# Patient Record
Sex: Male | Born: 1980 | Marital: Single | Smoking: Never smoker
Health system: Southern US, Community
[De-identification: ages and names within clinical notes are randomized; demographics above are authoritative.]

---

## 2013-02-03 ENCOUNTER — Ambulatory Visit (INDEPENDENT_AMBULATORY_CARE_PROVIDER_SITE_OTHER): Payer: No Typology Code available for payment source | Admitting: Family Medicine

## 2013-02-03 VITALS — BP 118/80 | HR 86 | Temp 98.1°F | Resp 20 | Ht 70.75 in | Wt 163.2 lb

## 2013-02-03 DIAGNOSIS — J029 Acute pharyngitis, unspecified: Secondary | ICD-10-CM

## 2013-02-03 DIAGNOSIS — R509 Fever, unspecified: Secondary | ICD-10-CM

## 2013-02-03 MED ORDER — AMOXICILLIN 875 MG PO TABS: 875.0000 mg | ORAL_TABLET | Freq: Two times a day (BID) | ORAL | Status: DC

## 2013-02-03 NOTE — Progress Notes (Signed)
°  Subjective:    Patient ID: David Mcpherson, male    DOB: 1980-08-25, 32 y.o.   MRN: [id number]  This chart was scribed for Elvina Sidle, MD by Blanchard Kelch, ED Scribe. The patient was seen in room 10. Patient's care was started at 5:19 PM.   HPI  David Mcpherson is a 32 y.o. male who presents to office complaining of sore throat that began four days ago. He is also complaining of fatigue, fever, postnasal drainage and myalgias. He has been taking Motrin for the fever, which has been temporarily relieving the fever. He states it has not yet broken and still has the fever when the medicine wears off.   He is going out of town tomorrow to [place] for a work conference and wanted to get checked out because the symptoms have remained unchanged since starting four days ago.   He works at [employer]. 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    Review of Systems  Constitutional: Positive for fever and fatigue.  HENT: Positive for postnasal drip and sore throat. Negative for drooling.   Eyes: Negative for discharge.  Respiratory: Negative for cough.   Cardiovascular: Negative for leg swelling.  Gastrointestinal: Negative for vomiting.  Endocrine: Negative for polyuria.  Genitourinary: Negative for hematuria.  Musculoskeletal: Positive for myalgias. Negative for gait problem.  Skin: Negative for rash.  Allergic/Immunologic: Negative for immunocompromised state.  Neurological: Negative for speech difficulty.  Hematological: Negative for adenopathy.  Psychiatric/Behavioral: Negative for confusion.       Objective:   Physical Exam  Constitutional: He is oriented to person, place, and time. He appears well-developed and well-nourished.  HENT:  Head: Normocephalic and atraumatic.  Right Ear: Tympanic membrane, external ear and ear canal normal.  Left Ear: Tympanic membrane, external ear and ear canal normal.  Mouth/Throat: Posterior  oropharyngeal erythema present.  Eyes: EOM are normal.  Neck: Normal range of motion. Neck supple.  Cardiovascular: Normal rate, regular rhythm and normal heart sounds.   No murmur heard. Pulmonary/Chest: Effort normal and breath sounds normal. No respiratory distress. He has no wheezes.  Neurological: He is alert and oriented to person, place, and time.  Skin: Skin is warm and dry.      Assessment & Plan:    I personally performed the services described in this documentation, which was scribed in my presence. The recorded information has been reviewed and is accurate.  Acute pharyngitis - Plan: amoxicillin (AMOXIL) 875 MG tablet  Fever, unspecified - Plan: amoxicillin (AMOXIL) 875 MG tablet  Signed, Elvina Sidle, MD

## 2014-12-27 ENCOUNTER — Ambulatory Visit (INDEPENDENT_AMBULATORY_CARE_PROVIDER_SITE_OTHER): Payer: 59

## 2014-12-27 ENCOUNTER — Ambulatory Visit (INDEPENDENT_AMBULATORY_CARE_PROVIDER_SITE_OTHER): Payer: 59 | Admitting: Emergency Medicine

## 2014-12-27 DIAGNOSIS — R55 Syncope and collapse: Secondary | ICD-10-CM | POA: Diagnosis not present

## 2014-12-27 DIAGNOSIS — E860 Dehydration: Secondary | ICD-10-CM | POA: Diagnosis not present

## 2014-12-27 DIAGNOSIS — R05 Cough: Secondary | ICD-10-CM | POA: Diagnosis not present

## 2014-12-27 DIAGNOSIS — R059 Cough, unspecified: Secondary | ICD-10-CM

## 2014-12-27 DIAGNOSIS — J189 Pneumonia, unspecified organism: Secondary | ICD-10-CM | POA: Diagnosis not present

## 2014-12-27 LAB — POCT CBC
Granulocyte percent: 82.1 %G — AB (ref 37–80)
HEMATOCRIT: 42 % — AB (ref 43.5–53.7)
Hemoglobin: 13.3 g/dL — AB (ref 14.1–18.1)
Lymph, poc: 1.5 (ref 0.6–3.4)
MCH, POC: 29.7 pg (ref 27–31.2)
MCHC: 31.6 g/dL — AB (ref 31.8–35.4)
MCV: 93.9 fL (ref 80–97)
MID (CBC): 0.6 (ref 0–0.9)
MPV: 6 fL (ref 0–99.8)
POC GRANULOCYTE: 9.7 — AB (ref 2–6.9)
POC LYMPH PERCENT: 12.9 %L (ref 10–50)
POC MID %: 5 %M (ref 0–12)
Platelet Count, POC: 304 10*3/uL (ref 142–424)
RBC: 4.47 M/uL — AB (ref 4.69–6.13)
RDW, POC: 12.3 %
WBC: 11.8 10*3/uL — AB (ref 4.6–10.2)

## 2014-12-27 LAB — GLUCOSE, POCT (MANUAL RESULT ENTRY): POC Glucose: 96 mg/dl (ref 70–99)

## 2014-12-27 MED ORDER — CEFTRIAXONE SODIUM 1 G IJ SOLR
1.0000 g | Freq: Once | INTRAMUSCULAR | Status: AC
  Administered 2014-12-27: 1 g via INTRAMUSCULAR

## 2014-12-27 MED ORDER — HYDROCOD POLST-CPM POLST ER 10-8 MG/5ML PO SUER: 5.0000 mL | Freq: Two times a day (BID) | ORAL | Status: DC

## 2014-12-27 MED ORDER — LEVOFLOXACIN 500 MG PO TABS: 500.0000 mg | ORAL_TABLET | Freq: Every day | ORAL | Status: DC

## 2014-12-27 MED ORDER — CEFTRIAXONE SODIUM 500 MG IJ SOLR
500.0000 mg | Freq: Once | INTRAMUSCULAR | Status: AC
  Administered 2014-12-27: 500 mg via INTRAMUSCULAR

## 2014-12-27 MED ORDER — CEFTRIAXONE SODIUM 1 G IJ SOLR: 500.0000 mg | Freq: Once | INTRAMUSCULAR | Status: DC

## 2014-12-27 MED ORDER — CEFTRIAXONE SODIUM 500 MG IJ SOLR: 500.0000 mg | Freq: Once | INTRAMUSCULAR | Status: DC

## 2014-12-27 NOTE — Addendum Note (Signed)
Addended by: Eddie Candle on: 12/27/2014 09:22 PM   Modules accepted: Orders

## 2014-12-27 NOTE — Patient Instructions (Signed)

## 2014-12-27 NOTE — Progress Notes (Signed)
Subjective:  Patient ID: David Mcpherson, male    DOB: 03-29-1980  Age: 34 y.o. MRN: [id number]  CC: No chief complaint on file.   HPI David Mcpherson presents  patients been ill for over a week with with fatigue and chills and decreased by mouth intake. He has had a cough that's productive of purulent sputum. He has had some nasal congestion. No nausea or vomiting or stool change. No rash. He's been debating with his wife whether she comes Dr. be seen for a week and she prevailed insisted that he come today while the waiting room he had a syncopal episode and passed out and lost consciousness. He did not strike his head and has no antecedent injury. Now has no neurologic or visual symptoms. Is alert and oriented 3  History David Mcpherson has no past medical history on file.   He has no past surgical history on file.   His  family history includes Heart disease in his father and paternal grandfather.  He   reports that he has never smoked. He does not have any smokeless tobacco history on file. He reports that he does not drink alcohol or use illicit drugs.  Outpatient Prescriptions Prior to Visit  Medication Sig Dispense Refill  . amoxicillin (AMOXIL) 875 MG tablet Take 1 tablet (875 mg total) by mouth 2 (two) times daily. 20 tablet 0   No facility-administered medications prior to visit.    Social History   Social History  . Marital Status: Single    Spouse Name: N/A  . Number of Children: N/A  . Years of Education: N/A   Social History Main Topics  . Smoking status: Never Smoker   . Smokeless tobacco: Not on file  . Alcohol Use: No  . Drug Use: No  . Sexual Activity: Not on file   Other Topics Concern  . Not on file   Social History Narrative  . No narrative on file     Review of Systems  Constitutional: Positive for appetite change and fatigue. Negative for fever and chills.  HENT: Positive for congestion. Negative for ear pain, postnasal drip, sinus  pressure and sore throat.   Eyes: Negative for pain and redness.  Respiratory: Positive for cough. Negative for shortness of breath and wheezing.   Cardiovascular: Negative for leg swelling.  Gastrointestinal: Negative for nausea, vomiting, abdominal pain, diarrhea, constipation and blood in stool.  Endocrine: Negative for polyuria.  Genitourinary: Negative for dysuria, urgency, frequency and flank pain.  Musculoskeletal: Negative for gait problem.  Skin: Negative for rash.  Neurological: Positive for syncope (A she had a syncopal episode in the waiting room with complete collapse and loss of consciousness). Negative for weakness and headaches.  Psychiatric/Behavioral: Negative for confusion and decreased concentration. The patient is not nervous/anxious.     Objective:  There were no vitals taken for this visit.  Physical Exam  Constitutional: He is oriented to person, place, and time. He appears well-developed and well-nourished. No distress.  HENT:  Head: Normocephalic and atraumatic.  Right Ear: External ear normal.  Left Ear: External ear normal.  Nose: Nose normal.  Eyes: Conjunctivae and EOM are normal. Pupils are equal, round, and reactive to light. No scleral icterus.  Neck: Normal range of motion. Neck supple. No tracheal deviation present.  Cardiovascular: Normal rate, regular rhythm and normal heart sounds.   Pulmonary/Chest: Effort normal. No respiratory distress. He has no wheezes. He has rales.  Abdominal: He exhibits no mass. There is  no tenderness. There is no rebound and no guarding.  Musculoskeletal: He exhibits no edema.  Lymphadenopathy:    He has no cervical adenopathy.  Neurological: He is alert and oriented to person, place, and time. Coordination normal.  Skin: Skin is warm and dry. No rash noted. There is pallor.  Psychiatric: He has a normal mood and affect. His behavior is normal.      Assessment & Plan:   Diagnoses and all orders for this  visit:  Syncope, unspecified syncope type -     POCT CBC -     POCT glucose (manual entry) -     EKG 12-Lead  Dehydration  Cough -     POCT CBC -     DG Chest 2 View; Future   The patient was hydrated with two liters of saline and give 1.5 gm rocephin intravenously admixed to the second bag.  He tolerated that treatment well and felt his "normal self".  It is my opinion the pneumonia is not visible due to his dehydration.  I have discontinued David Mcpherson amoxicillin.  No orders of the defined types were placed in this encounter.   An IV was initiated  with normal saline and he was rehydrated Appropriate red flag conditions were discussed with the patient as well as actions that should be taken.  Patient expressed his understanding.  Follow-up: No Follow-up on file.  Carmelina Dane, MD     Results for orders placed or performed in visit on 12/27/14  POCT CBC  Result Value Ref Range   WBC 11.8 (A) 4.6 - 10.2 K/uL   Lymph, poc 1.5 0.6 - 3.4   POC LYMPH PERCENT 12.9 10 - 50 %L   MID (cbc) 0.6 0 - 0.9   POC MID % 5.0 0 - 12 %M   POC Granulocyte 9.7 (A) 2 - 6.9   Granulocyte percent 82.1 (A) 37 - 80 %G   RBC 4.47 (A) 4.69 - 6.13 M/uL   Hemoglobin 13.3 (A) 14.1 - 18.1 g/dL   HCT, POC 78.4 (A) 69.6 - 53.7 %   MCV 93.9 80 - 97 fL   MCH, POC 29.7 27 - 31.2 pg   MCHC 31.6 (A) 31.8 - 35.4 g/dL   RDW, POC 29.5 %   Platelet Count, POC 304 142 - 424 K/uL   MPV 6.0 0 - 99.8 fL  POCT glucose (manual entry)  Result Value Ref Range   POC Glucose 96 70 - 99 mg/dl    [facility] reading (PRIMARY) by  Dr. No visible pneumonia.Marland Kitchen

## 2015-01-08 ENCOUNTER — Ambulatory Visit (INDEPENDENT_AMBULATORY_CARE_PROVIDER_SITE_OTHER): Payer: 59 | Admitting: Family Medicine

## 2015-01-08 ENCOUNTER — Ambulatory Visit (INDEPENDENT_AMBULATORY_CARE_PROVIDER_SITE_OTHER): Payer: 59

## 2015-01-08 VITALS — BP 116/98 | HR 62 | Temp 97.9°F | Resp 16 | Ht 70.0 in | Wt 165.0 lb

## 2015-01-08 DIAGNOSIS — J189 Pneumonia, unspecified organism: Secondary | ICD-10-CM

## 2015-01-08 LAB — POCT CBC
Granulocyte percent: 70.3 %G (ref 37–80)
HEMATOCRIT: 41.4 % — AB (ref 43.5–53.7)
HEMOGLOBIN: 14.2 g/dL (ref 14.1–18.1)
LYMPH, POC: 1.5 (ref 0.6–3.4)
MCH, POC: 31.7 pg — AB (ref 27–31.2)
MCHC: 34.2 g/dL (ref 31.8–35.4)
MCV: 92.8 fL (ref 80–97)
MID (cbc): 0.3 (ref 0–0.9)
MPV: 6 fL (ref 0–99.8)
POC GRANULOCYTE: 4.2 (ref 2–6.9)
POC LYMPH %: 25.3 % (ref 10–50)
POC MID %: 4.4 % (ref 0–12)
Platelet Count, POC: 298 10*3/uL (ref 142–424)
RBC: 4.46 M/uL — AB (ref 4.69–6.13)
RDW, POC: 12.1 %
WBC: 6 10*3/uL (ref 4.6–10.2)

## 2015-01-08 MED ORDER — PREDNISONE 20 MG PO TABS: ORAL_TABLET | ORAL | Status: DC

## 2015-01-08 MED ORDER — PSEUDOEPH-BROMPHEN-DM 30-2-10 MG/5ML PO SYRP: 5.0000 mL | ORAL_SOLUTION | Freq: Four times a day (QID) | ORAL | Status: DC | PRN

## 2015-01-08 MED ORDER — ALBUTEROL SULFATE (2.5 MG/3ML) 0.083% IN NEBU
2.5000 mg | INHALATION_SOLUTION | Freq: Once | RESPIRATORY_TRACT | Status: AC
  Administered 2015-01-08: 2.5 mg via RESPIRATORY_TRACT

## 2015-01-08 MED ORDER — PHENYLEPHRINE-CHLORPHEN-DM 3.5-1-3 MG/ML PO LIQD: 2.5000 mL | Freq: Three times a day (TID) | ORAL | Status: DC | PRN

## 2015-01-08 NOTE — Patient Instructions (Signed)

## 2015-01-08 NOTE — Progress Notes (Addendum)
Subjective:    Patient ID: Praneel Haisley, male    DOB: 12-Jul-1980, 34 y.o.   MRN: [id number] Chief Complaint  Patient presents with  . Cough    x 2 weeks, Pt. was seen last week for pneumonia, but is not feeling better  . chest congestion    x 2 week  . Sinusitis    x 2 weeks    HPI  Pt was seen here 2 weeks ago and was diagnosed with pneumonia- illess started almost 3 wks ago - he had a syncopal episode from hypotension in the office and was treated with IVFs and given IM Rocephin and then started on levaquin - unfortunately, pt did not understand that an antibiotic had been sent to his pharmacy and so did not actually get the levaquin until 1 wk ago - he is on d6.  He is feeling a little better but definitely still ill.  Is having a lot of congestion and sinus pressure with post-nasal drip. He is sleeping well but is still feeling very winded during the day, getting fatigued much faster, and has DOE. He has been taking sudafed, motrin, zyrtec, zicam.  Can't take mucinex - makes him dizzy. Not having as much a fever/chills or production of cough. Pt has been taking tussionex which works at night - wakes up feeling great with less congestion, takes sudafed in the morning but sxs gradually worsen. Has no h/o any sig pulm illness, no prior RAD/asthma, no tob use or second hand smoke exposures. Does have a h/o sinus infection and cong that is difficult to treat/resolve.  History reviewed. No pertinent past medical history. Current Outpatient Prescriptions on File Prior to Visit  Medication Sig Dispense Refill  . chlorpheniramine-HYDROcodone (TUSSIONEX PENNKINETIC ER) 10-8 MG/5ML SUER Take 5 mLs by mouth 2 (two) times daily. 60 mL 0  . levofloxacin (LEVAQUIN) 500 MG tablet Take 1 tablet (500 mg total) by mouth daily. 10 tablet 0   No current facility-administered medications on file prior to visit.   No Known Allergies   Review of Systems  Constitutional: Positive for diaphoresis,  activity change and fatigue. Negative for fever and chills.  HENT: Positive for congestion, postnasal drip, rhinorrhea, sinus pressure and sore throat. Negative for ear pain, mouth sores and trouble swallowing.   Respiratory: Positive for cough, chest tightness, shortness of breath and wheezing.   Cardiovascular: Negative for chest pain, palpitations and leg swelling.  Gastrointestinal: Negative for nausea, vomiting, abdominal pain, diarrhea and constipation.  Genitourinary: Negative for dysuria.  Musculoskeletal: Negative for myalgias, arthralgias, neck pain and neck stiffness.  Skin: Negative for rash.  Neurological: Positive for dizziness and weakness. Negative for syncope.  Hematological: Negative for adenopathy.  Psychiatric/Behavioral: Positive for sleep disturbance.       Objective:  BP 116/98 mmHg  Pulse 62  Temp(Src) 97.9 F (36.6 C) (Oral)  Resp 16  Ht  (1.778 m)  Wt 165 lb (74.844 kg)  BMI 23.68 kg/m2  SpO2 98%  Physical Exam  Constitutional: He is oriented to person, place, and time. He appears well-developed and well-nourished. No distress.  HENT:  Head: Normocephalic and atraumatic.  Right Ear: External ear and ear canal normal. Tympanic membrane is retracted. A middle ear effusion is present.  Left Ear: External ear and ear canal normal. Tympanic membrane is injected. Tympanic membrane is not retracted.  No middle ear effusion.  Nose: Mucosal edema present. No rhinorrhea. Right sinus exhibits maxillary sinus tenderness. Left sinus exhibits maxillary  sinus tenderness.  Mouth/Throat: Uvula is midline and mucous membranes are normal. Posterior oropharyngeal erythema present. No oropharyngeal exudate or posterior oropharyngeal edema.  Eyes: Conjunctivae are normal. Right eye exhibits no discharge. Left eye exhibits no discharge. No scleral icterus.  Neck: Normal range of motion. Neck supple. No thyromegaly present.  Cardiovascular: Normal rate, regular rhythm,  normal heart sounds and intact distal pulses.   Pulmonary/Chest: Effort normal. No respiratory distress. He has decreased breath sounds in the right lower field, the left upper field and the left lower field. He has wheezes in the left lower field. He has rales in the left lower field.  Air movement improved sig after alb neb but lung sounds persisted and sxs unchanged.  Lymphadenopathy:       Head (right side): No submandibular adenopathy present.       Head (left side): No submandibular adenopathy present.    He has no cervical adenopathy.       Right: No supraclavicular adenopathy present.       Left: No supraclavicular adenopathy present.  Neurological: He is alert and oriented to person, place, and time.  Skin: Skin is warm and dry. He is not diaphoretic. No erythema.  Psychiatric: He has a normal mood and affect. His behavior is normal.      Results for orders placed or performed in visit on 01/08/15  POCT CBC  Result Value Ref Range   WBC 6.0 4.6 - 10.2 K/uL   Lymph, poc 1.5 0.6 - 3.4   POC LYMPH PERCENT 25.3 10 - 50 %L   MID (cbc) 0.3 0 - 0.9   POC MID % 4.4 0 - 12 %M   POC Granulocyte 4.2 2 - 6.9   Granulocyte percent 70.3 37 - 80 %G   RBC 4.46 (A) 4.69 - 6.13 M/uL   Hemoglobin 14.2 14.1 - 18.1 g/dL   HCT, POC 60.441.4 (A) 54.043.5 - 53.7 %   MCV 92.8 80 - 97 fL   MCH, POC 31.7 (A) 27 - 31.2 pg   MCHC 34.2 31.8 - 35.4 g/dL   RDW, POC 98.112.1 %   Platelet Count, POC 298 142 - 424 K/uL   MPV 6.0 0 - 99.8 fL      [facility] reading (PRIMARY) by  Dr. Clelia CroftShaw. CXR: Small infiltrate in lower rt lung but left lung improved sig.  Dg Chest 2 View  01/08/2015  CLINICAL DATA:  Followup community acquired pneumonia. EXAM: CHEST  2 VIEW COMPARISON:  12/27/2014 FINDINGS: Left lower lobe airspace disease has resolved since previous study. Right lung is clear. No evidence of pleural effusion. Heart size and mediastinal contours are within normal limits. No evidence of pneumothorax. IMPRESSION:  Resolution of left lower lobe airspace disease since prior study. No active disease. Electronically Signed   By: Myles RosenthalJohn  Stahl M.D.   On: 01/08/2015 17:02   Dg Chest 2 View  12/27/2014  CLINICAL DATA:  Shortness of breath and cough. EXAM: CHEST  2 VIEW COMPARISON:  None. FINDINGS: Normal heart size. Normal mediastinal contour. No pneumothorax. No pleural effusion. There is prominent patchy consolidation throughout the left lower lobe. No pulmonary edema. IMPRESSION: Prominent left lower lobe patchy consolidation, most in keeping with a left lower lobe pneumonia. Recommend follow-up post treatment PA and lateral chest radiographs in 6-8 weeks. These results were called by telephone at the time of interpretation on 12/27/2014 at 2:52 pm to Dr. Phillips OdorJEFFERY ANDERSON , who verbally acknowledged these results. Electronically Signed   By: Barbara CowerJason  A Poff M.D.   On: 12/27/2014 15:00    Assessment & Plan:   1. CAP (community acquired pneumonia)   Pt with definite improvement but still frustrated by the persistent cough and congestion which is mainly coming from upper respiratory. Pt will complete additional 4d of levaquin and start trial of pred taper for sinus cong. Sxs are responding well to chlorphenramine and sudafed so will try Bromphed Dm instead - cannot tolerate of guaifensin. Given trial sample of delsym. Did not have symptomatic improvement to alb neb.  Orders Placed This Encounter  Procedures  . DG Chest 2 View    Standing Status: Future     Number of Occurrences: 1     Standing Expiration Date: 01/08/2016    Order Specific Question:  Reason for Exam (SYMPTOM  OR DIAGNOSIS REQUIRED)    Answer:  pneumonia sxs persisting    Order Specific Question:  Preferred imaging location?    Answer:  External  . POCT CBC    Meds ordered this encounter  Medications  . albuterol (PROVENTIL) (2.5 MG/3ML) 0.083% nebulizer solution 2.5 mg    Sig:                  . predniSONE (DELTASONE) 20 MG tablet    Sig:  Take 3 tabs po qd x 3d, 2 tabs po qd x 3d, 1 tab po qd x 3d    Dispense:  18 tablet    Refill:  0  . brompheniramine-pseudoephedrine-DM 30-2-10 MG/5ML syrup    Sig: Take 5 mLs by mouth 4 (four) times daily as needed.    Dispense:  120 mL    Refill:  0    Norberto Sorenson, MD MPH

## 2015-07-04 ENCOUNTER — Ambulatory Visit (INDEPENDENT_AMBULATORY_CARE_PROVIDER_SITE_OTHER): Payer: 59 | Admitting: Physician Assistant

## 2015-07-04 VITALS — BP 118/70 | HR 108 | Temp 97.7°F | Resp 16 | Ht 70.0 in | Wt 166.2 lb

## 2015-07-04 DIAGNOSIS — R070 Pain in throat: Secondary | ICD-10-CM | POA: Diagnosis not present

## 2015-07-04 DIAGNOSIS — J069 Acute upper respiratory infection, unspecified: Secondary | ICD-10-CM

## 2015-07-04 DIAGNOSIS — R509 Fever, unspecified: Secondary | ICD-10-CM | POA: Diagnosis not present

## 2015-07-04 LAB — POCT RAPID STREP A (OFFICE): RAPID STREP A SCREEN: NEGATIVE

## 2015-07-04 MED ORDER — AZITHROMYCIN 250 MG PO TABS: ORAL_TABLET | ORAL | Status: DC

## 2015-07-04 NOTE — Patient Instructions (Addendum)
IF you received an x-ray today, you will receive an invoice from [facility]. Please contact [facility] at [phone] with questions or concerns regarding your invoice.   IF you received labwork today, you will receive an invoice from [employer]. Please contact [facility] at [phone] with questions or concerns regarding your invoice.   Our billing staff will not be able to assist you with questions regarding bills from these companies.  You will be contacted with the lab results as soon as they are available. The fastest way to get your results is to activate your My Chart account. Instructions are located on the last page of this paperwork. If you have not heard from [place] regarding the results in 2 weeks, please contact this office.    Continue hydration and rest. You can use cepacol lozenges for the throat pain. You will also start the flonase nasal spray at this time. Please take the zyrtec as well.  Please take the claritin at your home with 10 mg of the claritin.   Please use a humidifier.    Upper Respiratory Infection, Adult Most upper respiratory infections (URIs) are a viral infection of the air passages leading to the lungs. A URI affects the nose, throat, and upper air passages. The most common type of URI is nasopharyngitis and is typically referred to as "the common cold." URIs run their course and usually go away on their own. Most of the time, a URI does not require medical attention, but sometimes a bacterial infection in the upper airways can follow a viral infection. This is called a secondary infection. Sinus and middle ear infections are common types of secondary upper respiratory infections. Bacterial pneumonia can also complicate a URI. A URI can worsen asthma and chronic obstructive pulmonary disease (COPD). Sometimes, these complications can require emergency medical care and may be life threatening.  CAUSES Almost  all URIs are caused by viruses. A virus is a type of germ and can spread from one person to another.  RISKS FACTORS You may be at risk for a URI if:   You smoke.   You have chronic heart or lung disease.  You have a weakened defense (immune) system.   You are very young or very old.   You have nasal allergies or asthma.  You work in crowded or poorly ventilated areas.  You work in health care facilities or schools. SIGNS AND SYMPTOMS  Symptoms typically develop 2-3 days after you come in contact with a cold virus. Most viral URIs last 7-10 days. However, viral URIs from the influenza virus (flu virus) can last 14-18 days and are typically more severe. Symptoms may include:   Runny or stuffy (congested) nose.   Sneezing.   Cough.   Sore throat.   Headache.   Fatigue.   Fever.   Loss of appetite.   Pain in your forehead, behind your eyes, and over your cheekbones (sinus pain).  Muscle aches.  DIAGNOSIS  Your health care provider may diagnose a URI by:  Physical exam.  Tests to check that your symptoms are not due to another condition such as:  Strep throat.  Sinusitis.  Pneumonia.  Asthma. TREATMENT  A URI goes away on its own with time. It cannot be cured with medicines, but medicines may be prescribed or recommended to relieve symptoms. Medicines may help:  Reduce your fever.  Reduce your cough.  Relieve nasal congestion. HOME CARE INSTRUCTIONS   Take medicines only  as directed by your health care provider.   Gargle warm saltwater or take cough drops to comfort your throat as directed by your health care provider.  Use a warm mist humidifier or inhale steam from a shower to increase air moisture. This may make it easier to breathe.  Drink enough fluid to keep your urine clear or pale yellow.   Eat soups and other clear broths and maintain good nutrition.   Rest as needed.   Return to work when your temperature has returned to  normal or as your health care provider advises. You may need to stay home longer to avoid infecting others. You can also use a face mask and careful hand washing to prevent spread of the virus.  Increase the usage of your inhaler if you have asthma.   Do not use any tobacco products, including cigarettes, chewing tobacco, or electronic cigarettes. If you need help quitting, ask your health care provider. PREVENTION  The best way to protect yourself from getting a cold is to practice good hygiene.   Avoid oral or hand contact with people with cold symptoms.   Wash your hands often if contact occurs.  There is no clear evidence that vitamin C, vitamin E, echinacea, or exercise reduces the chance of developing a cold. However, it is always recommended to get plenty of rest, exercise, and practice good nutrition.  SEEK MEDICAL CARE IF:   You are getting worse rather than better.   Your symptoms are not controlled by medicine.   You have chills.  You have worsening shortness of breath.  You have brown or red mucus.  You have yellow or brown nasal discharge.  You have pain in your face, especially when you bend forward.  You have a fever.  You have swollen neck glands.  You have pain while swallowing.  You have white areas in the back of your throat. SEEK IMMEDIATE MEDICAL CARE IF:   You have severe or persistent:  Headache.  Ear pain.  Sinus pain.  Chest pain.  You have chronic lung disease and any of the following:  Wheezing.  Prolonged cough.  Coughing up blood.  A change in your usual mucus.  You have a stiff neck.  You have changes in your:  Vision.  Hearing.  Thinking.  Mood. MAKE SURE YOU:   Understand these instructions.  Will watch your condition.  Will get help right away if you are not doing well or get worse.   This information is not intended to replace advice given to you by your health care provider. Make sure you discuss any  questions you have with your health care provider.   Document Released: 09/05/2000 Document Revised: 07/27/2014 Document Reviewed: 06/17/2013 Elsevier Interactive Patient Education [employer]2016 [employer]

## 2015-07-04 NOTE — Progress Notes (Signed)
[facility] [address] [place] [address] [id number]  Date:  07/04/2015   Name:  Foday Cone   DOB:  08/12/1980   MRN:  [id number]  PCP:  No primary care provider on file.  Chief Complaint  Patient presents with  . Sore Throat    x 2 days  . Fever    x 2 days      History of Present Illness:  Ontario Pettengill is a 35 y.o. male patient who presents to [facility] for cc of sore throat  He has been having drainage from.  He vomited secondary to the drainage. Subjective fever and chills, Though no temperature documented.  Taking tylenol and motrin.  Breaking out in soaking wet sweats.  No facial pain.He reports no recent history of allergies.  He has not had a cough surrounding this.    There are no active problems to display for this patient.   History reviewed. No pertinent past medical history.  History reviewed. No pertinent past surgical history.  Social History  Substance Use Topics  . Smoking status: Never Smoker   . Smokeless tobacco: None  . Alcohol Use: No    Family History  Problem Relation Age of Onset  . Heart disease Father   . Heart disease Paternal Grandfather     No Known Allergies  Medication list has been reviewed and updated.  Current Outpatient Prescriptions on File Prior to Visit  Medication Sig Dispense Refill  . chlorpheniramine-HYDROcodone (TUSSIONEX PENNKINETIC ER) 10-8 MG/5ML SUER Take 5 mLs by mouth 2 (two) times daily. (Patient not taking: Reported on 07/04/2015) 60 mL 0  . levofloxacin (LEVAQUIN) 500 MG tablet Take 1 tablet (500 mg total) by mouth daily. (Patient not taking: Reported on 07/04/2015) 10 tablet 0   No current facility-administered medications on file prior to visit.    ROS ROS otherwise unremarkable unless listed above.  Physical Examination: BP 118/70 mmHg  Pulse 108  Temp(Src) 97.7 F (36.5 C) (Oral)  Resp 16  Ht  (1.778 m)  Wt 166 lb 3.2 oz (75.388 kg)  BMI 23.85 kg/m2   SpO2 98% Ideal Body Weight: Weight in (lb) to have BMI = 25: 173.9  Physical Exam  Constitutional: He is oriented to person, place, and time. He appears well-developed and well-nourished. No distress.  HENT:  Head: Atraumatic.  Right Ear: Tympanic membrane, external ear and ear canal normal.  Left Ear: Tympanic membrane, external ear and ear canal normal.  Nose: Mucosal edema and rhinorrhea present. Right sinus exhibits no maxillary sinus tenderness and no frontal sinus tenderness. Left sinus exhibits no maxillary sinus tenderness and no frontal sinus tenderness.  Mouth/Throat: No uvula swelling. No oropharyngeal exudate, posterior oropharyngeal edema or posterior oropharyngeal erythema.  Eyes: Conjunctivae, EOM and lids are normal. Pupils are equal, round, and reactive to light. Right eye exhibits normal extraocular motion. Left eye exhibits normal extraocular motion.  Neck: Trachea normal and full passive range of motion without pain. No edema and no erythema present.  Cardiovascular: Normal rate.   Pulmonary/Chest: Effort normal. No respiratory distress. He has no decreased breath sounds. He has no wheezes. He has no rhonchi.  Neurological: He is alert and oriented to person, place, and time.  Skin: Skin is warm and dry. He is not diaphoretic.  Psychiatric: He has a normal mood and affect. His behavior is normal.    Results for orders placed or performed in visit on 07/04/15  Culture, Group  A Strep  Result Value Ref Range   Organism ID, Bacteria Normal Upper Respiratory Flora    Organism ID, Bacteria No Beta Hemolytic Streptococci Isolated   POCT rapid strep A  Result Value Ref Range   Rapid Strep A Screen Negative Negative    Assessment and Plan: Kathrine HaddockMichael Lee Mccary is a 35 y.o. male who is here today for upper throat pain and cough. -Likely viral URI. -We will treat supportively at this time. I have advised that if he has no improvement in the next 3 days that he can start the  azithromycin.  Acute upper respiratory infection - Plan: azithromycin (ZITHROMAX) 250 MG tablet, DISCONTINUED: azithromycin (ZITHROMAX) 250 MG tablet  Throat pain - Plan: POCT rapid strep A, azithromycin (ZITHROMAX) 250 MG tablet, Culture, Group A Strep, DISCONTINUED: azithromycin (ZITHROMAX) 250 MG tablet  Fever and chills - Plan: azithromycin (ZITHROMAX) 250 MG tablet, DISCONTINUED: azithromycin (ZITHROMAX) 250 MG tablet  Trena PlattStephanie English, PA-C [facility] 07/04/2015 9:23 AM

## 2015-07-06 LAB — CULTURE, GROUP A STREP: ORGANISM ID, BACTERIA: NORMAL

## 2016-01-24 ENCOUNTER — Ambulatory Visit (INDEPENDENT_AMBULATORY_CARE_PROVIDER_SITE_OTHER): Payer: 59 | Admitting: Physician Assistant

## 2016-01-24 VITALS — BP 118/76 | HR 72 | Temp 97.5°F | Resp 20 | Ht 70.0 in | Wt 162.8 lb

## 2016-01-24 DIAGNOSIS — H6122 Impacted cerumen, left ear: Secondary | ICD-10-CM

## 2016-01-24 NOTE — Progress Notes (Signed)
   David HaddockMichael Lee Mcpherson  MRN: [id number] DOB: 09/14/1980  PCP: No primary care provider on file.  Subjective:  Pt is a 35 year old male who presents to clinic for left ear lavage. He was at [facility] this afternoon being fitted for custom ear pieces. The technician advised ear wax removal for a proper fitting. He uses Q-tips occasionally. Has no prior history of cerumen impaction.  Denies ringing of the ears, ear fullness, decreased hearing, disequilibrium.   Review of Systems  Constitutional: Negative for chills and fever.  HENT: Negative for ear discharge, ear pain, hearing loss and tinnitus.   Respiratory: Negative.   Cardiovascular: Negative.   Neurological: Negative for dizziness, light-headedness and headaches.    There are no active problems to display for this patient.   Current Outpatient Prescriptions on File Prior to Visit  Medication Sig Dispense Refill  . azithromycin (ZITHROMAX) 250 MG tablet Take 2 tabs PO x 1 dose, then 1 tab PO QD x 4 days (Patient not taking: Reported on 01/24/2016) 6 tablet 0  . chlorpheniramine-HYDROcodone (TUSSIONEX PENNKINETIC ER) 10-8 MG/5ML SUER Take 5 mLs by mouth 2 (two) times daily. (Patient not taking: Reported on 01/24/2016) 60 mL 0   No current facility-administered medications on file prior to visit.     No Known Allergies  Objective:  BP 118/76 (BP Location: Right Arm, Patient Position: Sitting, Cuff Size: Small)   Pulse 72   Temp 97.5 F (36.4 C) (Oral)   Resp 20   Ht 5\' 10"  (1.778 m)   Wt 162 lb 12.8 oz (73.8 kg)   SpO2 100%   BMI 23.36 kg/m   Physical Exam  Constitutional: He is oriented to person, place, and time and well-developed, well-nourished, and in no distress. No distress.  HENT:  Right Ear: Tympanic membrane and ear canal normal.  Left ear canal impacted with cerumen.   Cardiovascular: Normal rate, regular rhythm and normal heart sounds.   Neurological: He is alert and oriented to person, place, and  time. GCS score is 15.  Skin: Skin is warm and dry.  Psychiatric: Mood, memory, affect and judgment normal.  Vitals reviewed.   Assessment and Plan :  1. Impacted cerumen of left ear - Ear wax removal - Home care for cerumen impaction discussed with patient. D/c use of Q-tips. Use mineral oil as needed to soften cerumen to allow normal transport out of canal.  - RTC if symptoms return. Patient understands and agrees.    Marco CollieWhitney Desira Alessandrini, PA-C  [facility] 01/24/2016 4:04 PM

## 2016-01-24 NOTE — Patient Instructions (Addendum)
Please do not use Q-tips, as this will further impact the ear wax in your ear.   If you have cerumen impaction more than once a year you can reduce the occurrences by using a cotton ball dipped in mineral oil and place it in the external canal for 10-20 minutes once a week (combined with eight hours of not using a hearing aid overnight, if applicable). This helps liquify cerumen and aid in the normal elimination mechanisms.    Routine cleaning of ears by a health professional every 6-12 months is recommended.   If you have itchy ears, use sweet oil. If you need more relief use a small amount of hydrocortisone ointment.   Cerumen Impaction The structures of the external ear canal secrete a waxy substance known as cerumen. Excess cerumen can build up in the ear canal, causing a condition known as cerumen impaction. Cerumen impaction can cause ear pain and disrupt the function of the ear. The rate of cerumen production differs for each individual. In certain individuals, the configuration of the ear canal may decrease his or her ability to naturally remove cerumen. CAUSES Cerumen impaction is caused by excessive cerumen production or buildup. RISK FACTORS  Frequent use of swabs to clean ears.  Having narrow ear canals.  Having eczema.  Being dehydrated. SIGNS AND SYMPTOMS  Diminished hearing.  Ear drainage.  Ear pain.  Ear itch. TREATMENT Treatment may involve:  Over-the-counter or prescription ear drops to soften the cerumen.  Removal of cerumen by a health care provider. This may be done with:  Irrigation with warm water. This is the most common method of removal.  Ear curettes and other instruments.  Surgery. This may be done in severe cases. HOME CARE INSTRUCTIONS  Take medicines only as directed by your health care provider.  Do not insert objects into the ear with the intent of cleaning the ear. PREVENTION  Do not insert objects into the ear, even with the intent  of cleaning the ear. Removing cerumen as a part of normal hygiene is not necessary, and the use of swabs in the ear canal is not recommended.  Drink enough water to keep your urine clear or pale yellow.  Control your eczema if you have it. SEEK MEDICAL CARE IF:  You develop ear pain.  You develop bleeding from the ear.  The cerumen does not clear after you use ear drops as directed.   This information is not intended to replace advice given to you by your health care provider. Make sure you discuss any questions you have with your health care provider.   Document Released: 04/19/2004 Document Revised: 04/02/2014 Document Reviewed: 10/27/2014 Elsevier Interactive Patient Education [employer].   Thank you for coming in today. I hope you feel we met your needs.  Feel free to call [facility] if you have any questions or further requests.  Please consider signing up for MyChart if you do not already have it, as this is a great way to communicate with me.  Best,  Whitney McVey, PA-C    IF you received an x-ray today, you will receive an invoice from [facility]. Please contact [facility] at [phone] with questions or concerns regarding your invoice.   IF you received labwork today, you will receive an invoice from [employer]. Please contact [facility] at [phone] with questions or concerns regarding your invoice.   Our billing staff will not be able to assist you with questions regarding bills from these  companies.  You will be contacted with the lab results as soon as they are available. The fastest way to get your results is to activate your My Chart account. Instructions are located on the last page of this paperwork. If you have not heard from [place] regarding the results in 2 weeks, please contact this office.

## 2016-05-03 ENCOUNTER — Ambulatory Visit (INDEPENDENT_AMBULATORY_CARE_PROVIDER_SITE_OTHER): Payer: 59 | Admitting: Physician Assistant

## 2016-05-03 VITALS — BP 110/72 | HR 72 | Temp 97.7°F | Resp 16 | Ht 70.0 in | Wt 158.8 lb

## 2016-05-03 DIAGNOSIS — Z298 Encounter for other specified prophylactic measures: Secondary | ICD-10-CM | POA: Diagnosis not present

## 2016-05-03 DIAGNOSIS — T753XXA Motion sickness, initial encounter: Secondary | ICD-10-CM | POA: Diagnosis not present

## 2016-05-03 MED ORDER — ONDANSETRON HCL 4 MG PO TABS: 4.0000 mg | ORAL_TABLET | Freq: Three times a day (TID) | ORAL | 0 refills | Status: DC | PRN

## 2016-05-03 MED ORDER — DOXYCYCLINE HYCLATE 100 MG PO CAPS: ORAL_CAPSULE | ORAL | 0 refills | Status: DC

## 2016-05-03 NOTE — Patient Instructions (Addendum)
Have a GREAT trip! Happy 10th Anniversary!    IF you received an x-ray today, you will receive an invoice from [facility]. Please contact [facility] at [phone] with questions or concerns regarding your invoice.   IF you received labwork today, you will receive an invoice from [facility]. Please contact [facility] at [phone] with questions or concerns regarding your invoice.   Our billing staff will not be able to assist you with questions regarding bills from these companies.  You will be contacted with the lab results as soon as they are available. The fastest way to get your results is to activate your My Chart account. Instructions are located on the last page of this paperwork. If you have not heard from us regarding the results in 2 weeks, please contact this office.

## 2016-05-03 NOTE — Progress Notes (Signed)
     Patient ID: Kathrine HaddockMichael Lee Wuest, male    DOB: 07/10/1980, 36 y.o.   MRN: [id number]  PCP: No primary care provider on file.  Chief Complaint  Patient presents with  . Travel Consult    need medication     Subjective:   Presents for consultation prior to travel to the [place] next week.  He and his wife are traveling to celebrate their 10th wedding anniversary and will be staying at a resort. They have no plans to hike or travel to rural areas or go caving. None-the-less, they have been advised to take malaria prophylaxis.    Review of Systems  Constitutional: Negative for chills and fever.  Respiratory: Negative for cough and shortness of breath.   Cardiovascular: Negative for chest pain, palpitations and leg swelling.  Gastrointestinal: Negative for diarrhea, nausea and vomiting.  Endocrine: Negative for polydipsia.  Genitourinary: Negative for dysuria, frequency and urgency.  Musculoskeletal: Negative for myalgias.  Skin: Negative for rash.  Neurological: Negative for dizziness and headaches.       There are no active problems to display for this patient.    Prior to Admission medications   Not on File     No Known Allergies     Objective:  Physical Exam  Constitutional: He is oriented to person, place, and time. He appears well-developed and well-nourished. He is active and cooperative. No distress.  BP 110/72 (BP Location: Right Arm, Patient Position: Sitting, Cuff Size: Small)   Pulse 72   Temp 97.7 F (36.5 C) (Oral)   Resp 16   Ht 5\' 10"  (1.778 m)   Wt 158 lb 12.8 oz (72 kg)   SpO2 99%   BMI 22.79 kg/m    Eyes: Conjunctivae are normal.  Pulmonary/Chest: Effort normal.  Neurological: He is alert and oriented to person, place, and time.  Psychiatric: He has a normal mood and affect. His speech is normal and behavior is normal.           Assessment & Plan:   1. Motion sickness, initial encounter Ondansetron works well for his  symptoms. - ondansetron (ZOFRAN) 4 MG tablet; Take 1-2 tablets (4-8 mg total) by mouth every 8 (eight) hours as needed for nausea or vomiting.  Dispense: 30 tablet; Refill: 0  2. Need for malaria prophylaxis If he talks with his wife and would rather have atovaquone-proguanil, he'll let me know. - doxycycline (VIBRAMYCIN) 100 MG capsule; Take one time daily beginning 1-2 days prior to travel and for 4 weeks upon return  Dispense: 42 capsule; Refill: 0   Fernande Brashelle S. Yoshiko Keleher, PA-C Physician Assistant-Certified Primary Care at [facility]

## 2016-05-07 ENCOUNTER — Other Ambulatory Visit: Payer: Self-pay | Admitting: Physician Assistant

## 2016-05-07 DIAGNOSIS — Z20828 Contact with and (suspected) exposure to other viral communicable diseases: Secondary | ICD-10-CM

## 2016-05-07 MED ORDER — OSELTAMIVIR PHOSPHATE 75 MG PO CAPS: 75.0000 mg | ORAL_CAPSULE | Freq: Every day | ORAL | 0 refills | Status: AC

## 2016-07-11 ENCOUNTER — Ambulatory Visit (INDEPENDENT_AMBULATORY_CARE_PROVIDER_SITE_OTHER): Payer: 59 | Admitting: Physician Assistant

## 2016-07-11 ENCOUNTER — Ambulatory Visit (INDEPENDENT_AMBULATORY_CARE_PROVIDER_SITE_OTHER): Payer: 59

## 2016-07-11 VITALS — BP 129/88 | HR 91 | Temp 97.5°F | Resp 18 | Ht 70.0 in | Wt 165.0 lb

## 2016-07-11 DIAGNOSIS — R9431 Abnormal electrocardiogram [ECG] [EKG]: Secondary | ICD-10-CM

## 2016-07-11 DIAGNOSIS — R002 Palpitations: Secondary | ICD-10-CM | POA: Diagnosis not present

## 2016-07-11 NOTE — Patient Instructions (Signed)
     IF you received an x-ray today, you will receive an invoice from [facility]. Please contact [facility] at [phone] with questions or concerns regarding your invoice.   IF you received labwork today, you will receive an invoice from [facility]. Please contact [facility] at [phone] with questions or concerns regarding your invoice.   Our billing staff will not be able to assist you with questions regarding bills from these companies.  You will be contacted with the lab results as soon as they are available. The fastest way to get your results is to activate your My Chart account. Instructions are located on the last page of this paperwork. If you have not heard from us regarding the results in 2 weeks, please contact this office.     

## 2016-07-11 NOTE — Progress Notes (Signed)
David Mcpherson  MRN: [id number] DOB: May 16, 1980  PCP: No PCP Per Patient  Subjective:  Pt is 36 year old no reported PMH who presents to clinic for episodes of his heart fluttering x 10 years.  Episodes are occurring more frequently, which is why her is here today.  He experiences "spells" where his heart will "skip beats" and his chest "feels empty".  At least once a day for the past month. Spells last about 1 minute. Nothing provokes them. Nothing makes them better.   This has happened to him before - First started noticing it when he drank caffeine - saw cardiologist and was negative for arhythmia. He has cut out caffeine before - his symptoms went away.  Denies chest pain, chest tightness, syncope, pre-syncope, headache.   He drinks coffee in the morning and 1-2 cokes daily. He stopped drinking caffeine last week - episodes are still occurring.  He drinks alcohol - in the past month or two he has had more alcohol than he normally does due to "random social events".   Hx seasonal allergies - flonase, sudafed 12 hr, motrin, claritin. Took these this morning due to increased allergy symptoms.  He is not on any daily medication.   Fhx - Father: atrial fibrillation with valve replacement.   Review of Systems  Constitutional: Negative for diaphoresis.  Respiratory: Negative for cough, chest tightness, shortness of breath and wheezing.   Cardiovascular: Positive for palpitations. Negative for chest pain and leg swelling.  Gastrointestinal: Negative for diarrhea, nausea and vomiting.  Neurological: Negative for dizziness, syncope, light-headedness and headaches.    There are no active problems to display for this patient.   No current outpatient prescriptions on file prior to visit.   No current facility-administered medications on file prior to visit.     No Known Allergies   Objective:  BP 129/88   Pulse 91   Temp 97.5 F (36.4 C) (Oral)   Resp 18   Ht '5\' 10"'  (1.778 m)    Wt 165 lb (74.8 kg)   SpO2 100%   BMI 23.68 kg/m   Physical Exam  Constitutional: He is oriented to person, place, and time and well-developed, well-nourished, and in no distress. No distress.  Neck: Carotid bruit is not present.  Cardiovascular: Normal rate, regular rhythm, S1 normal, S2 normal, normal heart sounds and normal pulses.   No murmur heard. Neurological: He is alert and oriented to person, place, and time. GCS score is 15.  Skin: Skin is warm and dry.  Psychiatric: Mood, memory, affect and judgment normal.  Vitals reviewed.   Dg Chest 2 View  Result Date: 07/11/2016 CLINICAL DATA:  Palpitations. EXAM: CHEST  2 VIEW COMPARISON:  01/08/2015 FINDINGS: The heart size and mediastinal contours are within normal limits. Both lungs are clear. The visualized skeletal structures are unremarkable. IMPRESSION: Normal chest x-ray. Electronically Signed   By: Marijo Sanes M.D.   On: 07/11/2016 13:37   EKG - left axis deviation, possible hypertrophy. No ST elevations. No atrial fibrillation.  Assessment and Plan :  1. Palpitations 2. Nonspecific abnormal electrocardiogram (ECG) (EKG) - EKG 12-Lead - TSH - CBC with Differential/Platelet - CMP14+EGFR - Ambulatory referral to Cardiology - DG Chest 2 View; Future - Acute on chronic palpitations .Pt c/o worsening palpitations over the past month. He has tried cutting out caffeine, however admits to increased amount of alcohol intake over the past month.  No acute findings in EKG. Chest x-ray is negative. Labs are  pending, will contact with results. Encouraged pt to cut down on/stop alcohol and caffeine. Will refer to cardiology.   Mercer Pod, PA-C  [facility] Care at [facility] 07/11/2016 12:27 PM

## 2016-07-12 LAB — CMP14+EGFR
ALT: 15 IU/L (ref 0–44)
AST: 15 IU/L (ref 0–40)
Albumin/Globulin Ratio: 1.8 (ref 1.2–2.2)
Albumin: 4.6 g/dL (ref 3.5–5.5)
Alkaline Phosphatase: 54 IU/L (ref 39–117)
BUN/Creatinine Ratio: 11 (ref 9–20)
BUN: 12 mg/dL (ref 6–20)
Bilirubin Total: 0.4 mg/dL (ref 0.0–1.2)
CO2: 27 mmol/L (ref 18–29)
Calcium: 9.1 mg/dL (ref 8.7–10.2)
Chloride: 99 mmol/L (ref 96–106)
Creatinine, Ser: 1.09 mg/dL (ref 0.76–1.27)
GFR calc Af Amer: 100 mL/min/{1.73_m2} (ref >59)
GFR calc non Af Amer: 86 mL/min/{1.73_m2} (ref >59)
Globulin, Total: 2.6 g/dL (ref 1.5–4.5)
Glucose: 87 mg/dL (ref 65–99)
Potassium: 4.3 mmol/L (ref 3.5–5.2)
Sodium: 141 mmol/L (ref 134–144)
Total Protein: 7.2 g/dL (ref 6.0–8.5)

## 2016-07-12 LAB — CBC WITH DIFFERENTIAL/PLATELET
Basophils Absolute: 0 10*3/uL (ref 0.0–0.2)
Basos: 0 %
EOS (ABSOLUTE): 0.1 10*3/uL (ref 0.0–0.4)
Eos: 1 %
Hematocrit: 40.9 % (ref 37.5–51.0)
Hemoglobin: 13.5 g/dL (ref 13.0–17.7)
Immature Grans (Abs): 0 10*3/uL (ref 0.0–0.1)
Immature Granulocytes: 0 %
Lymphocytes Absolute: 1.2 10*3/uL (ref 0.7–3.1)
Lymphs: 29 %
MCH: 31.5 pg (ref 26.6–33.0)
MCHC: 33 g/dL (ref 31.5–35.7)
MCV: 96 fL (ref 79–97)
Monocytes Absolute: 0.4 10*3/uL (ref 0.1–0.9)
Monocytes: 9 %
Neutrophils Absolute: 2.6 10*3/uL (ref 1.4–7.0)
Neutrophils: 61 %
Platelets: 159 10*3/uL (ref 150–379)
RBC: 4.28 x10E6/uL (ref 4.14–5.80)
RDW: 13.1 % (ref 12.3–15.4)
WBC: 4.2 10*3/uL (ref 3.4–10.8)

## 2016-07-12 LAB — TSH: TSH: 0.656 u[IU]/mL (ref 0.450–4.500)

## 2016-07-12 NOTE — Progress Notes (Signed)
Please call pt and let him know his thyroid, kindeys, liver and blood counts look great. They are all within normal limits.  Thank you!

## 2016-07-19 DIAGNOSIS — R002 Palpitations: Secondary | ICD-10-CM | POA: Diagnosis not present

## 2016-07-19 DIAGNOSIS — R9431 Abnormal electrocardiogram [ECG] [EKG]: Secondary | ICD-10-CM | POA: Diagnosis not present

## 2016-07-20 DIAGNOSIS — R002 Palpitations: Secondary | ICD-10-CM | POA: Diagnosis not present

## 2016-07-30 DIAGNOSIS — R9431 Abnormal electrocardiogram [ECG] [EKG]: Secondary | ICD-10-CM | POA: Diagnosis not present

## 2016-08-07 ENCOUNTER — Encounter: Payer: Self-pay | Admitting: Physician Assistant

## 2016-08-07 ENCOUNTER — Ambulatory Visit (INDEPENDENT_AMBULATORY_CARE_PROVIDER_SITE_OTHER): Payer: 59 | Admitting: Physician Assistant

## 2016-08-07 VITALS — BP 136/78 | HR 98 | Temp 98.6°F | Ht 70.0 in | Wt 160.2 lb

## 2016-08-07 DIAGNOSIS — R059 Cough, unspecified: Secondary | ICD-10-CM

## 2016-08-07 DIAGNOSIS — R05 Cough: Secondary | ICD-10-CM

## 2016-08-07 DIAGNOSIS — J22 Unspecified acute lower respiratory infection: Secondary | ICD-10-CM

## 2016-08-07 MED ORDER — BENZONATATE 100 MG PO CAPS: 100.0000 mg | ORAL_CAPSULE | Freq: Three times a day (TID) | ORAL | 0 refills | Status: DC | PRN

## 2016-08-07 MED ORDER — GUAIFENESIN ER 1200 MG PO TB12: 1.0000 | ORAL_TABLET | Freq: Two times a day (BID) | ORAL | 1 refills | Status: DC | PRN

## 2016-08-07 MED ORDER — AZITHROMYCIN 250 MG PO TABS: ORAL_TABLET | ORAL | 0 refills | Status: DC

## 2016-08-07 NOTE — Progress Notes (Signed)
[facility] [address], [place] [phone]  Date:  08/07/2016   Name:  David Mcpherson   DOB:  07/23/1980   MRN:  [id number]  PCP:  Patient, No Pcp Per    History of Present Illness:  David Mcpherson is a 36 y.o. male patient who presents to PCP with  Chief Complaint  Patient presents with  . Cough    4 weeks cough and congestion       4 weeks symptoms began with productive cough, and fever for 1 night.  Energy had improved but had a lingering cough.  In the last week, cough and congestion, fatigue.  Coughing fits can cause some trouble with breathing, but otherwise normal.  No hoarseness, ear discomfort.  He had some sneezing and watery eyes, but this improved.  Productive cough is clear sputum.  He has some post nasal drip.  No facial pain.      There are no active problems to display for this patient.   No past medical history on file.  No past surgical history on file.  Social History  Substance Use Topics  . Smoking status: Never Smoker  . Smokeless tobacco: Never Used  . Alcohol use No    Family History  Problem Relation Age of Onset  . Heart disease Father   . Heart disease Paternal Grandfather     No Known Allergies  Medication list has been reviewed and updated.  No current outpatient prescriptions on file prior to visit.   No current facility-administered medications on file prior to visit.     ROS ROS otherwise unremarkable unless listed above.  Physical Examination: BP 136/78 (BP Location: Right Arm, Patient Position: Sitting, Cuff Size: Normal)   Pulse 98   Temp 98.6 F (37 C) (Oral)   Ht 5\' 10"  (1.778 m)   Wt 160 lb 3.2 oz (72.7 kg)   BMI 22.99 kg/m  Ideal Body Weight: Weight in (lb) to have BMI = 25: 173.9  Physical Exam  Constitutional: He is oriented to person, place, and time. He appears well-developed and well-nourished. No distress.  HENT:  Head: Normocephalic and atraumatic.  Right Ear:  Tympanic membrane, external ear and ear canal normal.  Left Ear: Tympanic membrane, external ear and ear canal normal.  Nose: Mucosal edema and rhinorrhea present. Right sinus exhibits no maxillary sinus tenderness and no frontal sinus tenderness. Left sinus exhibits no maxillary sinus tenderness and no frontal sinus tenderness.  Mouth/Throat: No uvula swelling. No oropharyngeal exudate, posterior oropharyngeal edema or posterior oropharyngeal erythema.  Eyes: Conjunctivae, EOM and lids are normal. Pupils are equal, round, and reactive to light. Right eye exhibits normal extraocular motion. Left eye exhibits normal extraocular motion.  Neck: Trachea normal and full passive range of motion without pain. No edema and no erythema present.  Cardiovascular: Normal rate.   Pulmonary/Chest: Effort normal. No respiratory distress. He has no decreased breath sounds. He has no wheezes. He has no rhonchi.  Neurological: He is alert and oriented to person, place, and time.  Skin: Skin is warm and dry. He is not diaphoretic.  Psychiatric: He has a normal mood and affect. His behavior is normal.     Assessment and Plan: David Mcpherson is a 36 y.o. male who is here today for UR symptoms. Will treat for bacterial etiology given longevity and worsening symptoms.   Advised heavy hydration rtc as needed.  Lower respiratory infection (e.g., bronchitis, pneumonia, pneumonitis, pulmonitis) -  Plan: azithromycin (ZITHROMAX) 250 MG tablet, Guaifenesin (MUCINEX MAXIMUM STRENGTH) 1200 MG TB12, benzonatate (TESSALON) 100 MG capsule  Cough - Plan: benzonatate (TESSALON) 100 MG capsule  Trena Platt, PA-C [facility] 5/16/201812:06 PM

## 2016-08-07 NOTE — Patient Instructions (Addendum)
Please continue to hydrate well with 64 oz of water if not more You can try taking 600mg  of the mucinex twice per day then increase to 2 tablets twice per day.  To insure that you are not getting to dizzy, we will start lower.      IF you received an x-ray today, you will receive an invoice from [facility]. Please contact [facility] at [phone] with questions or concerns regarding your invoice.   IF you received labwork today, you will receive an invoice from [facility]. Please contact [facility] at [phone] with questions or concerns regarding your invoice.   Our billing staff will not be able to assist you with questions regarding bills from these companies.  You will be contacted with the lab results as soon as they are available. The fastest way to get your results is to activate your My Chart account. Instructions are located on the last page of this paperwork. If you have not heard from us regarding the results in 2 weeks, please contact this office.

## 2016-08-23 DIAGNOSIS — R002 Palpitations: Secondary | ICD-10-CM | POA: Diagnosis not present

## 2016-09-10 ENCOUNTER — Ambulatory Visit (INDEPENDENT_AMBULATORY_CARE_PROVIDER_SITE_OTHER): Payer: 59 | Admitting: Physician Assistant

## 2016-09-10 ENCOUNTER — Encounter: Payer: Self-pay | Admitting: Physician Assistant

## 2016-09-10 VITALS — BP 121/79 | HR 85 | Temp 98.5°F | Resp 16 | Ht 71.0 in | Wt 161.0 lb

## 2016-09-10 DIAGNOSIS — R21 Rash and other nonspecific skin eruption: Secondary | ICD-10-CM

## 2016-09-10 DIAGNOSIS — L299 Pruritus, unspecified: Secondary | ICD-10-CM

## 2016-09-10 MED ORDER — HYDROXYZINE HCL 25 MG PO TABS: 25.0000 mg | ORAL_TABLET | Freq: Four times a day (QID) | ORAL | 1 refills | Status: AC | PRN

## 2016-09-10 MED ORDER — PREDNISONE 20 MG PO TABS: ORAL_TABLET | ORAL | 0 refills | Status: AC

## 2016-09-10 NOTE — Progress Notes (Signed)
   Kathrine HaddockMichael Lee Carbon  MRN: [id number] DOB: 11/03/1980  PCP: Patient, No Pcp Per  Subjective:  Pt is a 36 year old male who presents to clinic for bites on his legs x 1 day. He was out in the woods 2 days ago and developed "over 100 bug bites" on his legs the next day. C/o itchiness and pain. He did not sleep last night because of these symptoms.  He has tried calamine lotion, hydrocortisone cream, benadryl, zyrtec and prednisone 20mg .   Denies abdominal pain, n/v, headache, fever, chills, swelling, n/t.   Review of Systems  Gastrointestinal: Negative for abdominal pain, diarrhea, nausea and vomiting.  Musculoskeletal: Negative for arthralgias, myalgias and neck stiffness.  Skin: Positive for rash.  Neurological: Negative for dizziness, weakness and headaches.  Psychiatric/Behavioral: Positive for sleep disturbance.    There are no active problems to display for this patient.   No current outpatient prescriptions on file prior to visit.   No current facility-administered medications on file prior to visit.     No Known Allergies   Objective:  BP 121/79   Pulse 85   Temp 98.5 F (36.9 C) (Oral)   Resp 16   Ht 5\' 11"  (1.803 m)   Wt 161 lb (73 kg)   SpO2 99%   BMI 22.45 kg/m   Physical Exam  Constitutional: He is oriented to person, place, and time and well-developed, well-nourished, and in no distress. No distress.  Cardiovascular: Normal rate, regular rhythm and normal heart sounds.   Neurological: He is alert and oriented to person, place, and time. GCS score is 15.  Skin: Skin is warm and dry. Rash noted.  Several dozen 1 cm lesions scattered about upper and lower LES. Feet are not affected. Raised erythematous papule with vesicular center. Not warm to touch. No bleeding, drainage, weeping. No pustules.   Psychiatric: Mood, memory, affect and judgment normal.  Vitals reviewed.   Assessment and Plan :  1. Rash and nonspecific skin eruption - predniSONE (DELTASONE)  20 MG tablet; Take 3 PO QAM x2days, 2 PO QAM x2days, 1 PO QAM x2days  Dispense: 12 tablet; Refill: 0  2. Itch - hydrOXYzine (ATARAX/VISTARIL) 25 MG tablet; Take 1 tablet (25 mg total) by mouth every 6 (six) hours as needed for itching.  Dispense: 60 tablet; Refill: 1 - Advised washing with soap and water. Con't topical calamine lotion. RTC in 5-7 days if no improvement.   Marco CollieWhitney Liani Caris, PA-C  [facility] 09/10/2016 11:39 AM

## 2016-09-10 NOTE — Patient Instructions (Addendum)
Please come back if you are not better in 5-7 days.   Thank you for coming in today. I hope you feel we met your needs.  Feel free to call [facility] if you have any questions or further requests.  Please consider signing up for MyChart if you do not already have it, as this is a great way to communicate with me.  Best,  Whitney McVey, PA-C   IF you received an x-ray today, you will receive an invoice from [facility]. Please contact [facility] at [phone] with questions or concerns regarding your invoice.   IF you received labwork today, you will receive an invoice from [facility]. Please contact [facility] at [phone] with questions or concerns regarding your invoice.   Our billing staff will not be able to assist you with questions regarding bills from these companies.  You will be contacted with the lab results as soon as they are available. The fastest way to get your results is to activate your My Chart account. Instructions are located on the last page of this paperwork. If you have not heard from [place] regarding the results in 2 weeks, please contact this office.

## 2017-01-28 DIAGNOSIS — Z23 Encounter for immunization: Secondary | ICD-10-CM | POA: Diagnosis not present

## 2017-02-18 DIAGNOSIS — J385 Laryngeal spasm: Secondary | ICD-10-CM | POA: Diagnosis not present

## 2017-02-18 DIAGNOSIS — R490 Dysphonia: Secondary | ICD-10-CM | POA: Diagnosis not present

## 2017-02-18 DIAGNOSIS — J383 Other diseases of vocal cords: Secondary | ICD-10-CM | POA: Diagnosis not present

## 2017-07-03 ENCOUNTER — Encounter: Payer: Self-pay | Admitting: Physician Assistant

## 2017-11-05 DIAGNOSIS — J302 Other seasonal allergic rhinitis: Secondary | ICD-10-CM | POA: Diagnosis not present

## 2017-11-05 DIAGNOSIS — Z91018 Allergy to other foods: Secondary | ICD-10-CM | POA: Diagnosis not present

## 2017-11-09 IMAGING — DX DG CHEST 2V
2 series · 3 of 3 positions shown · non-contrast
Comparison: 01/08/2015

CLINICAL DATA: Palpitations.

EXAM:
CHEST  2 VIEW

[chest pa]
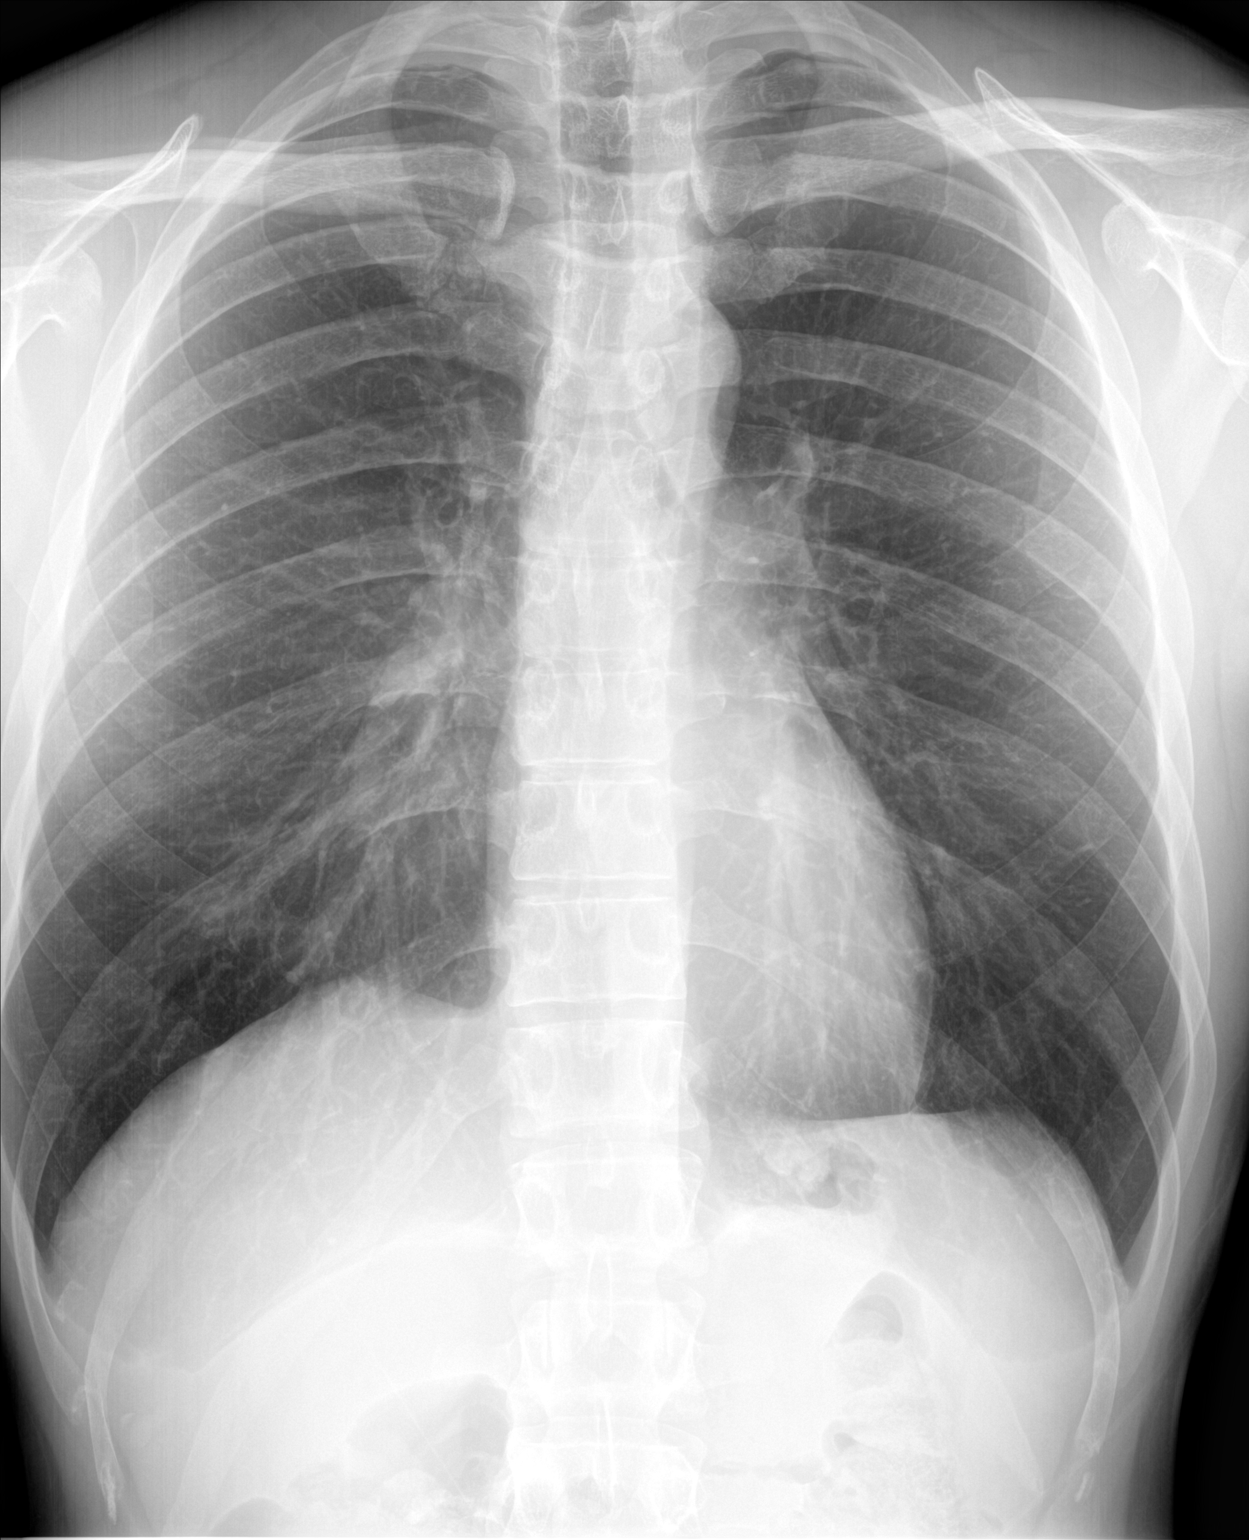

[Series 2: chest lat · 0.14mm/px · 2 of 2 slices shown]
[im 1/2]
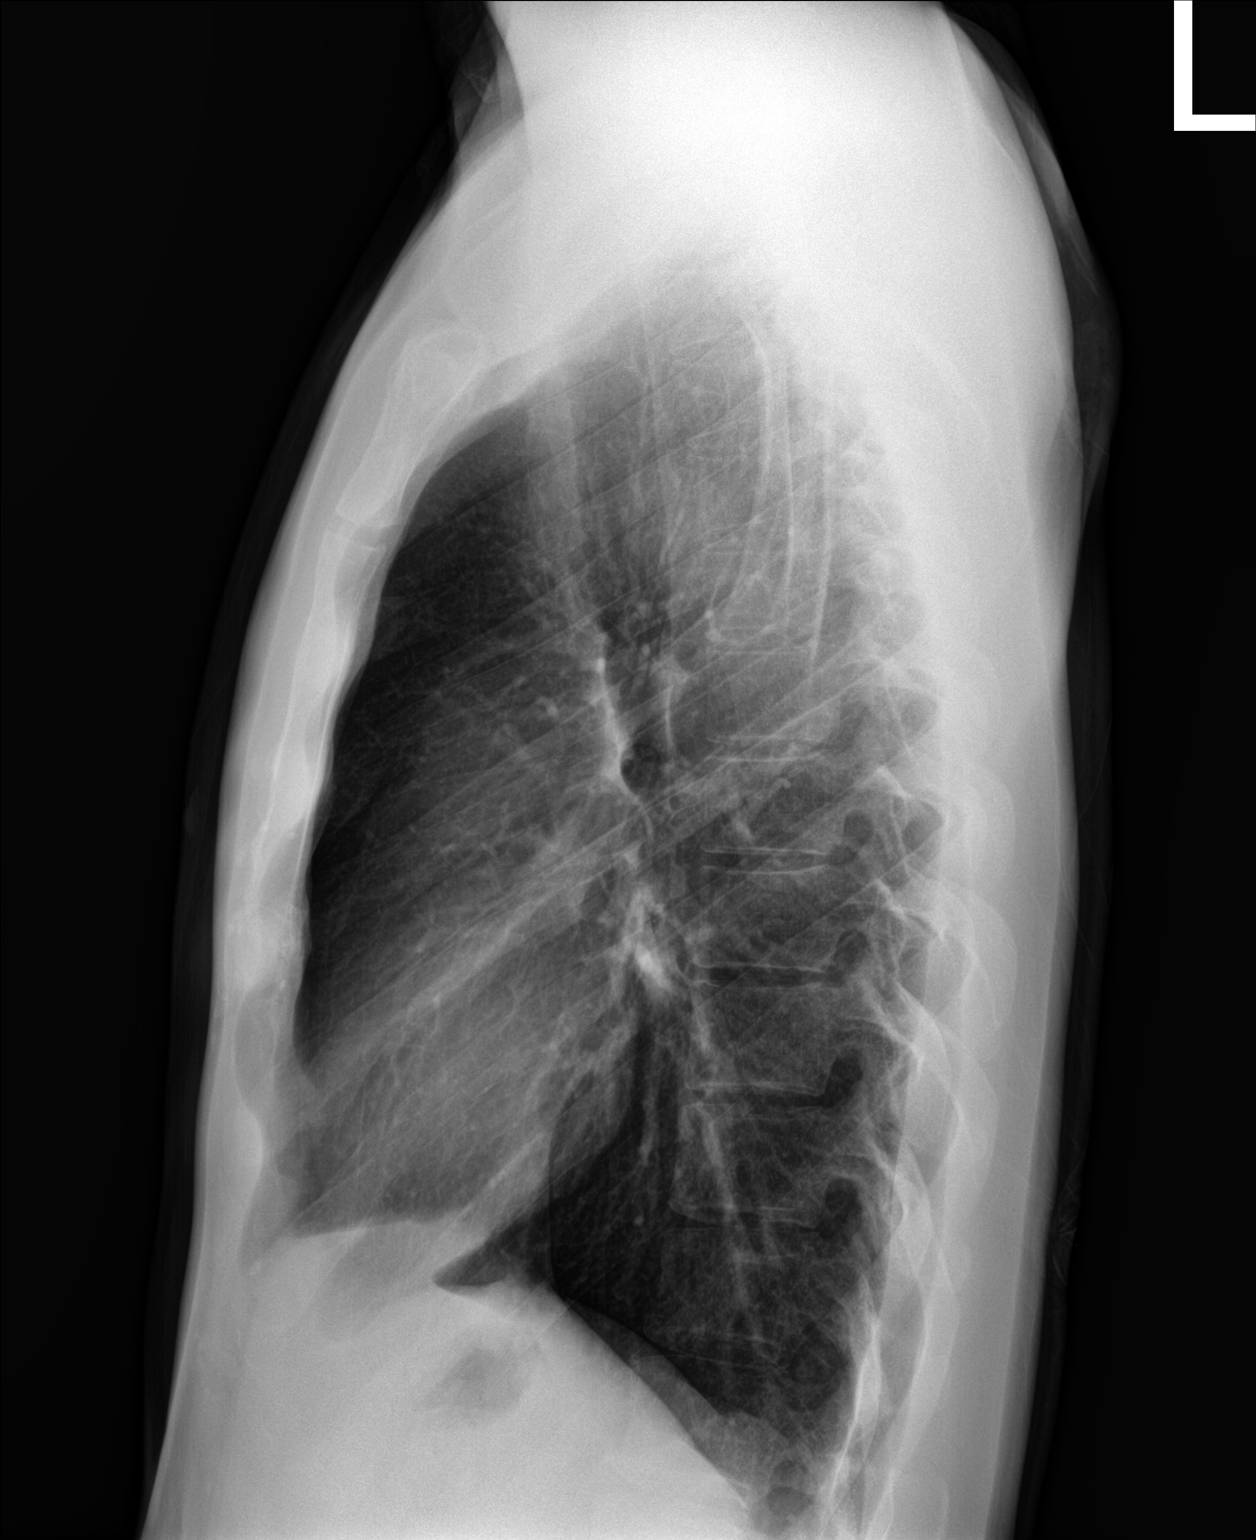
[im 2/2]
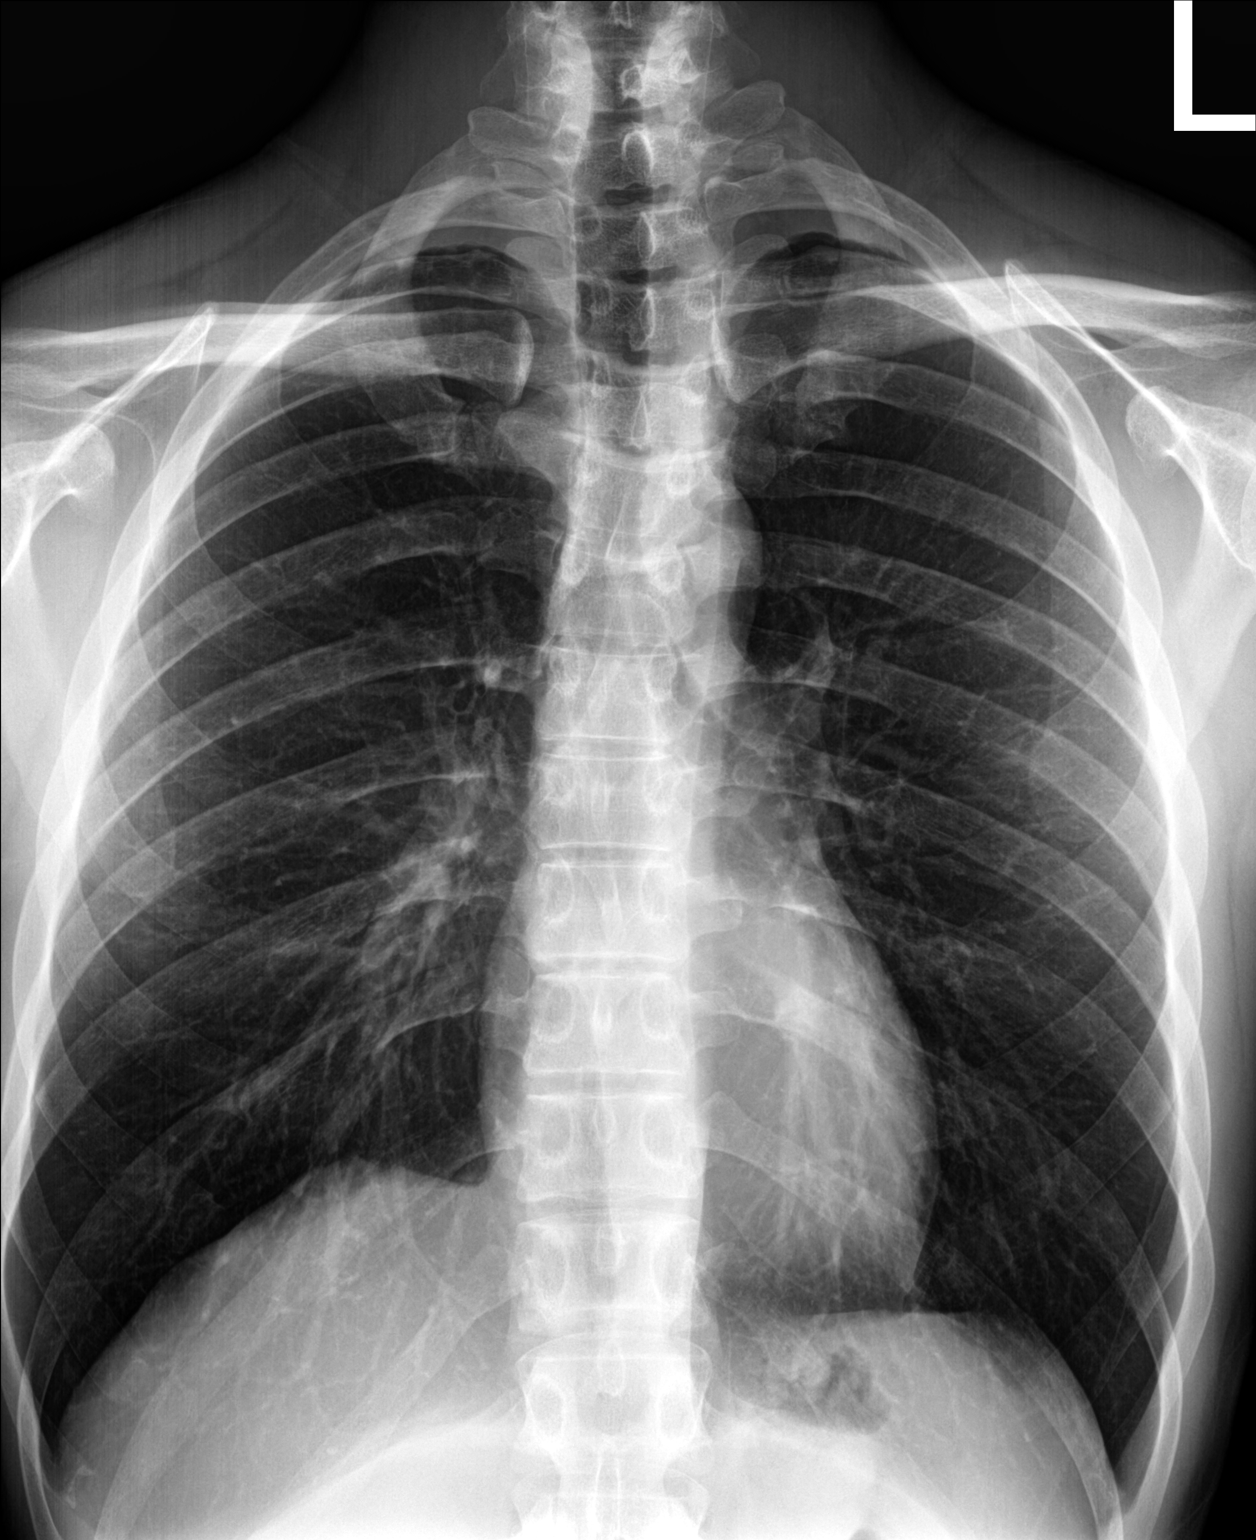

[3 of 3 positions shown; findings below may reference images not displayed]

FINDINGS: The heart size and mediastinal contours are within normal limits.
Both lungs are clear. The visualized skeletal structures are
unremarkable.
IMPRESSION: Normal chest x-ray.

## 2018-01-15 DIAGNOSIS — R1084 Generalized abdominal pain: Secondary | ICD-10-CM | POA: Diagnosis not present

## 2018-01-15 DIAGNOSIS — Z6822 Body mass index (BMI) 22.0-22.9, adult: Secondary | ICD-10-CM | POA: Diagnosis not present

## 2020-05-23 DIAGNOSIS — H9202 Otalgia, left ear: Secondary | ICD-10-CM | POA: Diagnosis not present

## 2020-09-28 DIAGNOSIS — M5416 Radiculopathy, lumbar region: Secondary | ICD-10-CM | POA: Diagnosis not present

## 2020-10-26 DIAGNOSIS — M79605 Pain in left leg: Secondary | ICD-10-CM | POA: Diagnosis not present

## 2020-10-26 DIAGNOSIS — M545 Low back pain, unspecified: Secondary | ICD-10-CM | POA: Diagnosis not present

## 2020-10-26 DIAGNOSIS — M6281 Muscle weakness (generalized): Secondary | ICD-10-CM | POA: Diagnosis not present

## 2020-11-07 DIAGNOSIS — M6281 Muscle weakness (generalized): Secondary | ICD-10-CM | POA: Diagnosis not present

## 2020-11-07 DIAGNOSIS — M545 Low back pain, unspecified: Secondary | ICD-10-CM | POA: Diagnosis not present

## 2020-11-07 DIAGNOSIS — M79605 Pain in left leg: Secondary | ICD-10-CM | POA: Diagnosis not present

## 2021-09-18 DIAGNOSIS — J0100 Acute maxillary sinusitis, unspecified: Secondary | ICD-10-CM | POA: Diagnosis not present

## 2021-09-18 DIAGNOSIS — J029 Acute pharyngitis, unspecified: Secondary | ICD-10-CM | POA: Diagnosis not present

## 2022-08-27 DIAGNOSIS — D72819 Decreased white blood cell count, unspecified: Secondary | ICD-10-CM | POA: Diagnosis not present

## 2022-08-27 DIAGNOSIS — R7989 Other specified abnormal findings of blood chemistry: Secondary | ICD-10-CM | POA: Diagnosis not present

## 2022-08-27 DIAGNOSIS — Z125 Encounter for screening for malignant neoplasm of prostate: Secondary | ICD-10-CM | POA: Diagnosis not present

## 2022-09-03 DIAGNOSIS — Z0000 Encounter for general adult medical examination without abnormal findings: Secondary | ICD-10-CM | POA: Diagnosis not present

## 2022-09-03 DIAGNOSIS — Z1331 Encounter for screening for depression: Secondary | ICD-10-CM | POA: Diagnosis not present

## 2022-09-03 DIAGNOSIS — Z1339 Encounter for screening examination for other mental health and behavioral disorders: Secondary | ICD-10-CM | POA: Diagnosis not present

## 2022-09-03 DIAGNOSIS — F331 Major depressive disorder, recurrent, moderate: Secondary | ICD-10-CM | POA: Diagnosis not present

## 2022-09-03 DIAGNOSIS — F418 Other specified anxiety disorders: Secondary | ICD-10-CM | POA: Diagnosis not present

## 2023-02-18 DIAGNOSIS — R490 Dysphonia: Secondary | ICD-10-CM | POA: Diagnosis not present

## 2023-09-04 DIAGNOSIS — Z125 Encounter for screening for malignant neoplasm of prostate: Secondary | ICD-10-CM | POA: Diagnosis not present

## 2023-09-04 DIAGNOSIS — D72819 Decreased white blood cell count, unspecified: Secondary | ICD-10-CM | POA: Diagnosis not present

## 2023-09-04 DIAGNOSIS — Z0000 Encounter for general adult medical examination without abnormal findings: Secondary | ICD-10-CM | POA: Diagnosis not present
